# Patient Record
Sex: Male | Born: 1970 | Race: Black or African American | Marital: Married | Smoking: Never smoker
Health system: Southern US, Community
[De-identification: ages and names within clinical notes are randomized; demographics above are authoritative.]

---

## 2016-03-23 ENCOUNTER — Other Ambulatory Visit: Payer: Self-pay | Admitting: Family Medicine

## 2016-03-23 DIAGNOSIS — Z1889 Other specified retained foreign body fragments: Secondary | ICD-10-CM

## 2016-03-23 DIAGNOSIS — M79672 Pain in left foot: Secondary | ICD-10-CM

## 2016-04-08 ENCOUNTER — Ambulatory Visit
Admission: RE | Admit: 2016-04-08 | Discharge: 2016-04-08 | Disposition: A | Discharge status: Home / Self Care | Payer: No Typology Code available for payment source | Source: Ambulatory Visit | Attending: Family Medicine | Admitting: Family Medicine

## 2016-04-08 DIAGNOSIS — Z1889 Other specified retained foreign body fragments: Secondary | ICD-10-CM

## 2016-04-08 DIAGNOSIS — M79672 Pain in left foot: Secondary | ICD-10-CM

## 2016-04-08 MED ORDER — GADOBENATE DIMEGLUMINE 529 MG/ML IV SOLN
20.0000 mL | Freq: Once | INTRAVENOUS | Status: AC | PRN
  Administered 2016-04-08: 20 mL via INTRAVENOUS

## 2017-04-20 IMAGING — MR MR FOOT*L* WO/W CM
6 of 9 series · 31 of 40 positions shown · IV contrast (20m multihance)
Comparison: None.

CLINICAL DATA: Left foot pain and swelling. Bunion. Weakness and
tenderness.

EXAM:
MRI OF THE LEFT FOREFOOT WITHOUT AND WITH CONTRAST
TECHNIQUE: Multiplanar, multisequence MR imaging of the left forefoot was
performed both before and after administration of intravenous
contrast.
CONTRAST:  20mL MULTIHANCE GADOBENATE DIMEGLUMINE 529 MG/ML IV SOLN
Creatinine was obtained on site at [HOSPITAL] at [HOSPITAL].
Results: Creatinine 1.3 mg/dL.

[Series 7: T1 · coronal · 4.0mm · 0.44mm/px · 6 of 33 slices shown (1 of 2)]
[im 1/33]
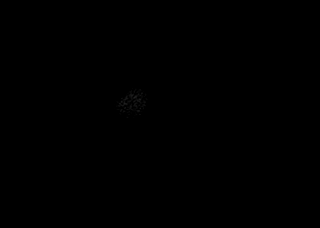
[im 7/33]
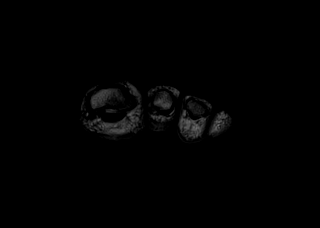
[im 13/33]
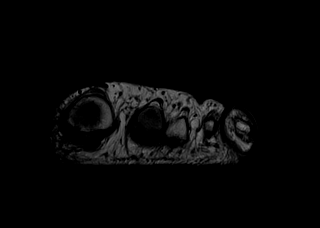
[im 20/33]
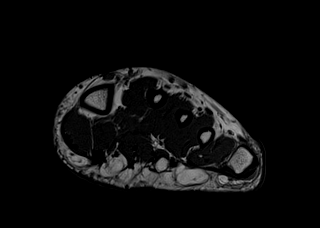
[im 26/33]
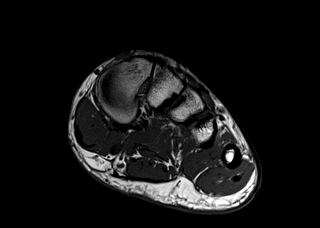
[im 33/33]
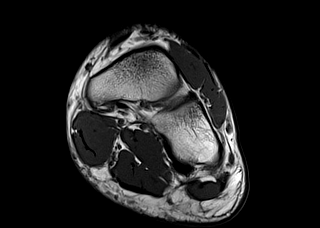

[Series 8: T2 fat-sat · coronal · 4.0mm · 0.27mm/px · 6 of 34 slices shown]
[im 1/34]
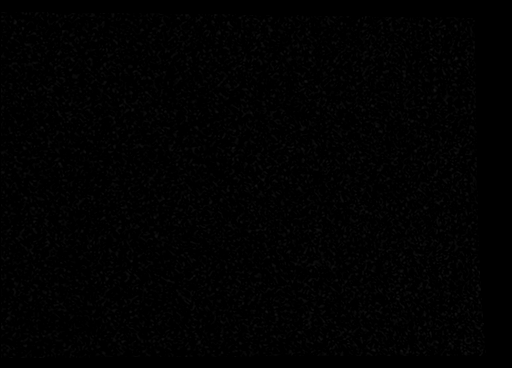
[im 7/34]
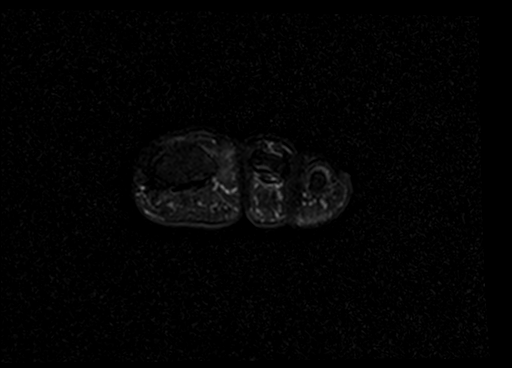
[im 14/34]
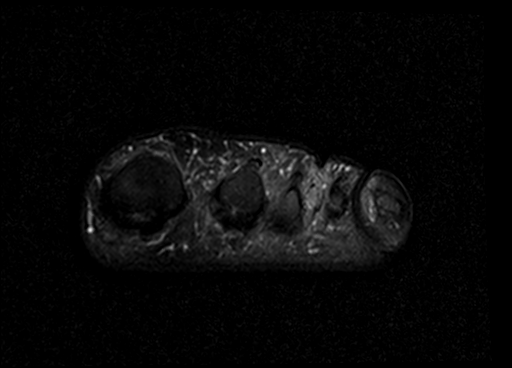
[im 20/34]
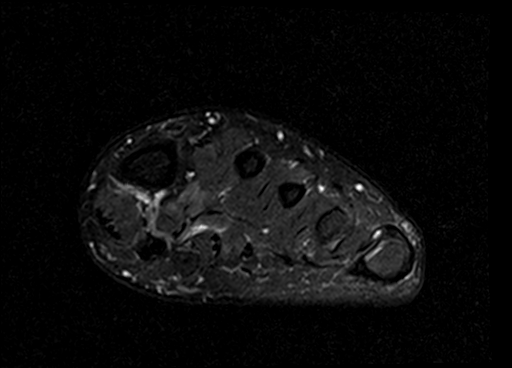
[im 27/34]
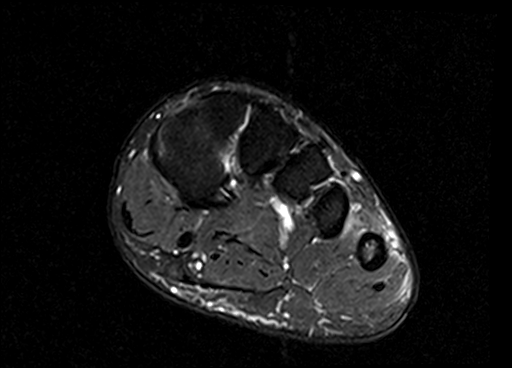
[im 34/34]
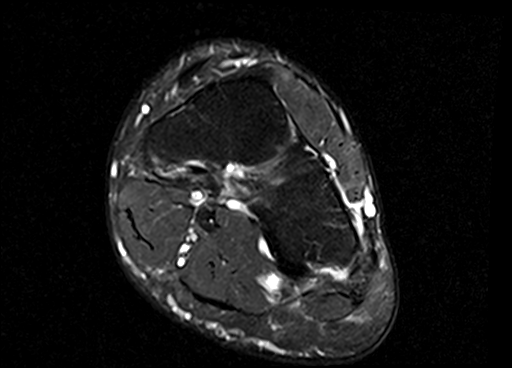

[Series 9: T1 fat-sat · coronal · 4.0mm · 0.44mm/px · 6 of 33 slices shown]
[im 1/33]
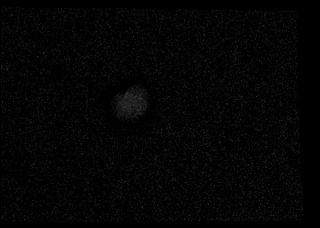
[im 7/33]
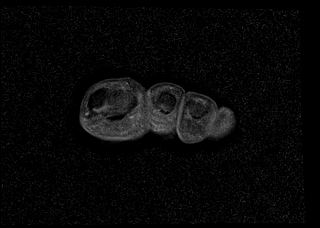
[im 13/33]
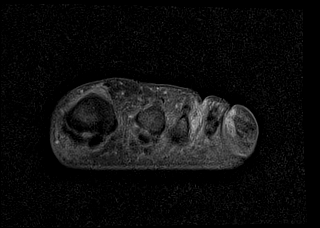
[im 20/33]
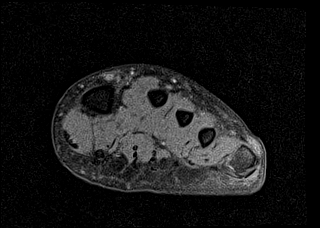
[im 26/33]
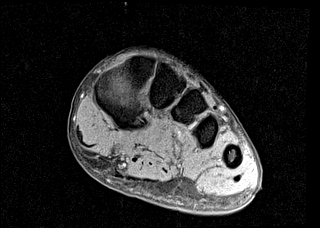
[im 33/33]
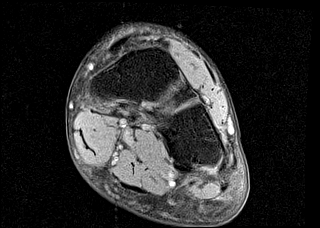

[Series 11: T1 · axial · 3.0mm · 0.31mm/px · z∈[-129,-73]mm · 3 of 20 slices shown (2 of 2)]
[im 1/20]
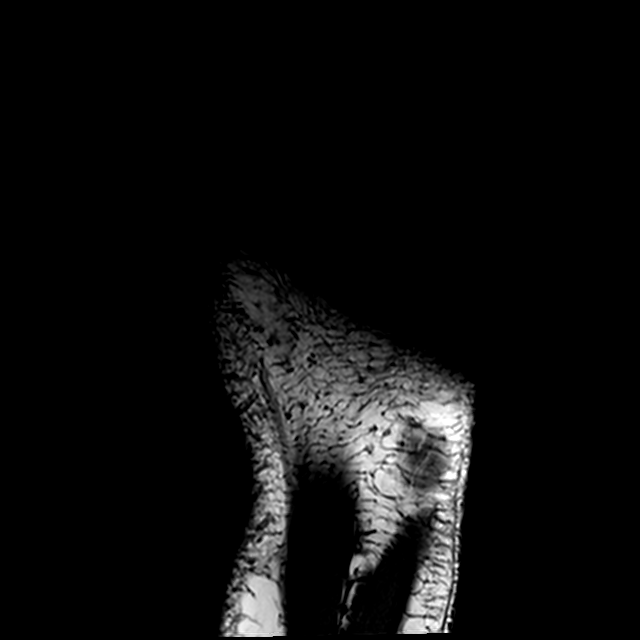
[im 10/20]
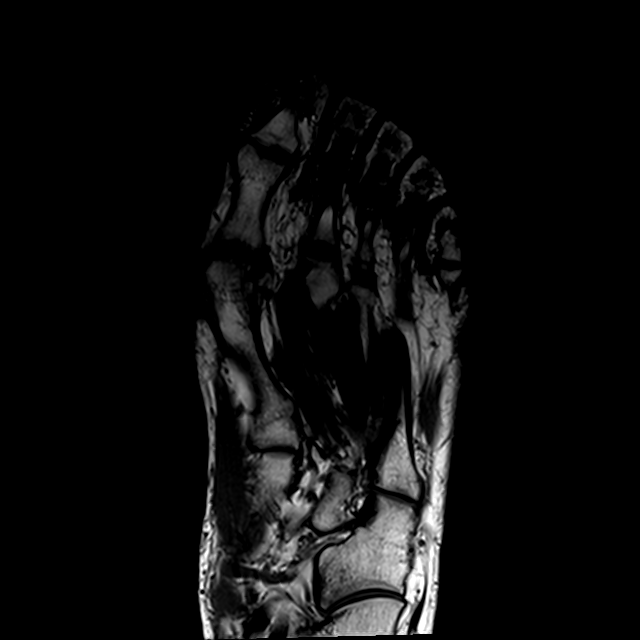
[im 20/20]
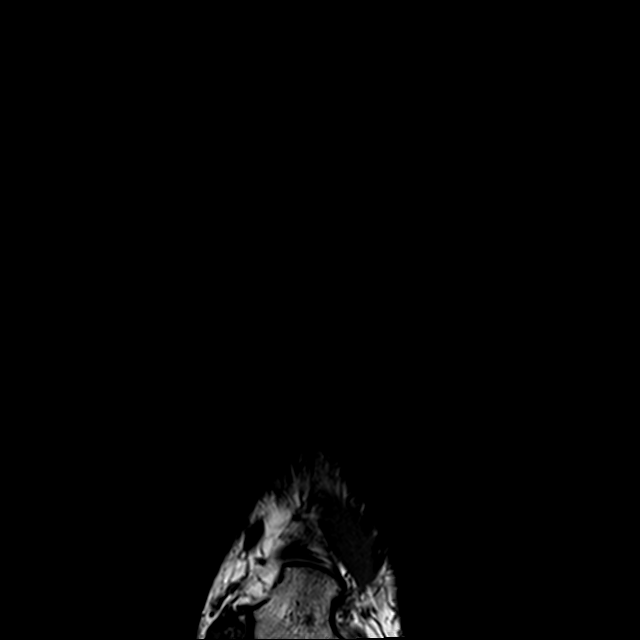

[Series 13: T1 fat-sat post-contrast · coronal · 4.0mm · 0.44mm/px · 6 of 34 slices shown (1 of 2)]
[im 1/34]
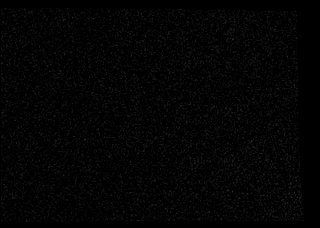
[im 7/34]
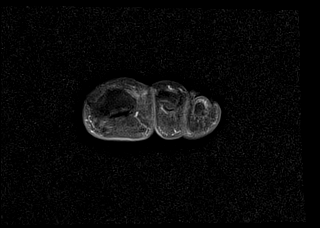
[im 14/34]
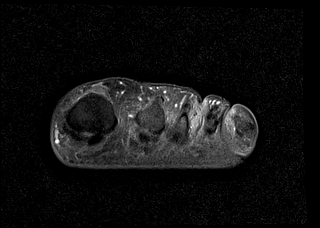
[im 20/34]
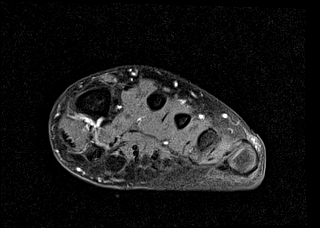
[im 27/34]
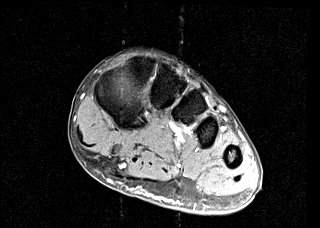
[im 34/34]
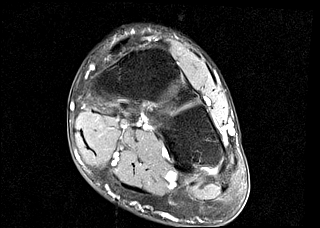

[Series 14: T1 fat-sat post-contrast · sagittal · 4.0mm · 0.35mm/px · 4 of 21 slices shown (2 of 2)]
[im 1/21]
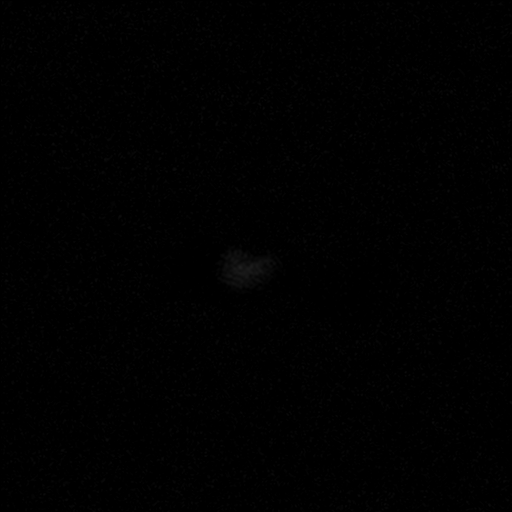
[im 7/21]
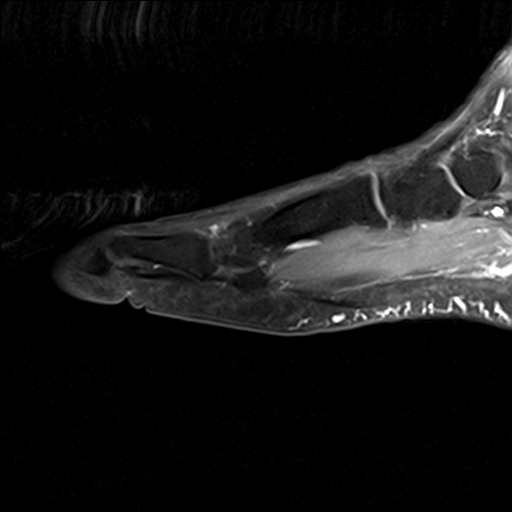
[im 14/21]
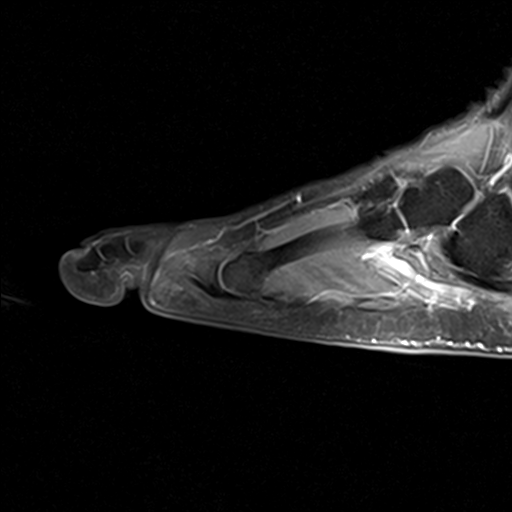
[im 21/21]
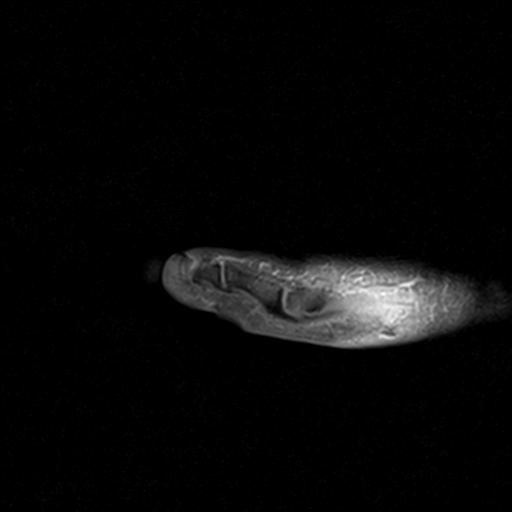

[31 of 40 positions shown; findings below may reference images not displayed]

FINDINGS: Bones/Joint/Cartilage

Degenerative subcortical cyst or small geode along the distal medial
margin of the first metatarsal head, image [DATE]. Mild hallux valgus
with mild spurring at the first MTP joint. Multipartite medial
sesamoid of the first digit with subtle associated enhancement in
the medial sesamoid which may reflect low grade inflammation, image
17/15. No other significant abnormal osseous edema is identified.
There is no evidence of a stress fracture in the visualized part of
the midfoot, metatarsals, or phalanges.

Ligaments

Lisfranc ligament normal. No specific ligamentous derangement along
the first digit or elsewhere in the forefoot.

Muscles and Tendons

No abnormal regional muscular edema or atrophy.

Soft tissues

No convincing intermetatarsal mass to suggest Morton's neuroma. No
significant intermetatarsal bursitis or other appreciable additional
abnormality to further explain the patient' s symptoms.
IMPRESSION: 1. Multipartite medial sesamoid of the first digit with some
low-level enhancement and edema suggesting mild sesamoiditis.
2. There is also degenerative arthropathy at the first MTP joint and
a small degenerative subcortical cyst or geode along the medial head
of the first metatarsal.
3. No stress fracture, muscular edema or atrophy, Morton's neuroma,
or other specific cause for the patient's described symptoms
identified.

## 2018-01-09 ENCOUNTER — Ambulatory Visit: Payer: No Typology Code available for payment source | Admitting: Podiatry

## 2018-01-16 ENCOUNTER — Ambulatory Visit (INDEPENDENT_AMBULATORY_CARE_PROVIDER_SITE_OTHER): Payer: No Typology Code available for payment source

## 2018-01-16 ENCOUNTER — Encounter: Payer: Self-pay | Admitting: Podiatry

## 2018-01-16 ENCOUNTER — Ambulatory Visit (INDEPENDENT_AMBULATORY_CARE_PROVIDER_SITE_OTHER): Payer: No Typology Code available for payment source | Admitting: Podiatry

## 2018-01-16 ENCOUNTER — Other Ambulatory Visit: Payer: Self-pay | Admitting: Podiatry

## 2018-01-16 VITALS — BP 142/89 | HR 75

## 2018-01-16 DIAGNOSIS — M722 Plantar fascial fibromatosis: Secondary | ICD-10-CM

## 2018-01-16 DIAGNOSIS — M779 Enthesopathy, unspecified: Secondary | ICD-10-CM | POA: Diagnosis not present

## 2018-01-16 DIAGNOSIS — M7750 Other enthesopathy of unspecified foot: Secondary | ICD-10-CM

## 2018-01-16 DIAGNOSIS — M129 Arthropathy, unspecified: Secondary | ICD-10-CM | POA: Diagnosis not present

## 2018-01-16 NOTE — Progress Notes (Signed)
This patient presents to the office with chief complaint of painful feet both as well as painful left ankle.  He states his feet have been painful for over 10 years now walking and wearing his shoes.  He was in the [employer] for over 20 years.  He says that his left ankle has become worse than his right ankle.  He shows me a picture of the significantly swollen left and right ankle.  He says the left ankle became swollen 6 weeks ago and it took 6 weeks for him to get this appointment.  He says that his ankle. His improved, but there is still mild pain at the left ankle and points to the ankle bone on the inside and the outside of the left ankle. It was difficult to acquire a timely history.  He says that he had blood work done and the gout level was 7 and he was told it was a high normal. He also says that hen diagnosed with plantar fasciitis on both feet.  He says that he has been taking ibuprofen and Aleve to help with the pain as well as stretching and rolling and exercising his foot.  He says he has already received 3 injections in his left foot.  His last injection was 1 year ago. He states he still has pain upon rising in the morning and standing from a sitting position in both heels. He says he is wearing orthoses that were made by [employer] in [place] and says they cause pain and discomfort when he wears them in his shoes He presents the office today for an evaluation and treatment of his painful.  Vascular  Dorsalis pedis and posterior tibial pulses are palpable  B/L.  Capillary return  WNL.  Temperature gradient is  WNL.  Skin turgor  WNL  Sensorium  Senn Weinstein monofilament wire  WNL. Normal tactile sensation.  Nail Exam  Patient has normal nails with no evidence of bacterial or fungal infection.  Orthopedic  Exam  Muscle tone and muscle strength  WNL.  No limitations of motion feet  B/L.  No crepitus or joint effusion noted.  Foot type is unremarkable and digits show no abnormalities. Palpable pain  noted to the medial and lateral malleolus left ankle.  Patient has palpable pain noted at the insertion of the plantar fascia, left greater than the right.  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  Functional hallux limitus first MPJ of the right foot. Flexible pes planus foot type.  Pes planus. Upon weight-bearing.  Skin  No open lesions.  Normal skin texture and turgor.  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    HAV left  FHL right   IE  X-rays taken reveal and elongated first metatarsal right foot.  Evaluation of the left foot reveals a dorsomedial exostosis with an HAV deformity.  Examination of his ankle reveals osteophytes in the gutter at the site of the medial malleolus.  Discussed this condition with this patient.  I told him I am concerned about his acute ankle swelling that has occurred in both feet.  Therefore, I ordered additional blood work and her arthritis profile.  There is no evidence of any acute or arthritic condition in his ankles. At this point,  I recommended we prescribe him a Mobic  to  help evaluate his condition.  A prescription for Mobic and a prescription for blood work were given to this patient so that he can  bring them to the [place].  I also suggested he received his previous blood work and return to the office in 2 weeks so we can evaluate his feet.   Helane Gunther DPM

## 2018-01-30 ENCOUNTER — Ambulatory Visit (INDEPENDENT_AMBULATORY_CARE_PROVIDER_SITE_OTHER): Payer: No Typology Code available for payment source | Admitting: Podiatry

## 2018-01-30 ENCOUNTER — Encounter: Payer: Self-pay | Admitting: Podiatry

## 2018-01-30 DIAGNOSIS — M779 Enthesopathy, unspecified: Secondary | ICD-10-CM

## 2018-01-30 DIAGNOSIS — M722 Plantar fascial fibromatosis: Secondary | ICD-10-CM | POA: Diagnosis not present

## 2018-01-30 DIAGNOSIS — M1A0710 Idiopathic chronic gout, right ankle and foot, without tophus (tophi): Secondary | ICD-10-CM

## 2018-01-30 DIAGNOSIS — M7750 Other enthesopathy of unspecified foot: Secondary | ICD-10-CM

## 2018-01-30 DIAGNOSIS — M129 Arthropathy, unspecified: Secondary | ICD-10-CM

## 2018-01-30 NOTE — Progress Notes (Signed)
This patient returns to the office follow-up for diagnosis of a tendinitis right ankle and plantar fasciitis.  This past patient was treated with Mobic by mouth.  He was also given a prescription to receive blood work for arthritis profile which he brought to the [place]. he returned to the office today stating that the results indicated increased gout.  He says that his right ankle is markedly improved from his previous visit.  He says the Mobic that he has taken was not successful in eliminating the pain and the swelling.  He says he still believes he has some pain and discomfort in the big toe joint of the left foot.  He is very pleased with his progress.  Vascular  Dorsalis pedis and posterior tibial pulses are palpable  B/L.  Capillary return  WNL.  Temperature gradient is  WNL.  Skin turgor  WNL  Sensorium  Senn Weinstein monofilament wire  WNL. Normal tactile sensation.  Nail Exam  Patient has normal nails with no evidence of bacterial or fungal infection.  Orthopedic  Exam  Muscle tone and muscle strength  WNL.  No limitations of motion feet  B/L.  No crepitus or joint effusion noted.  No evidence of any pain or swelling noted on the right ankle.  No increased temperature of the right ankle.  Continue pain noted through the arch of both feet.  Patient has an HAV deformity first MPJ bilateral with no limitation of motion.  Mild discomfort first MPJ left foot.  Skin  No open lesions.  Normal skin texture and turgor.  Diagnosis gout chronic arthropathy plantar fasciitis  ROV.  Reviewed his blood work which revealed his gout level to be 8.1.  This is elevated from the 7.0 which he told me he had 2 weeks ago.  His right ankle has markedly improved and he is not having any pain or discomfort.  He still has plantar fascial pain due to his flatfootedness.  I told this patient that he should continue on the Mobic and to wear the orthoses which I reviewed last visit.  I told him he needs to see his medical  doctor and be started on gout medication to prevent future flareups.  RTC prn.   Helane Gunther DPM

## 2018-02-02 ENCOUNTER — Telehealth: Payer: Self-pay | Admitting: Podiatry

## 2018-02-02 NOTE — Telephone Encounter (Signed)
I saw Dr. Stacie Acres on Monday, 30 September. I'm needing a copy of my AVS to get travel pay from the [place]. If you could please e-mail the AVS to me at [email or web address]. My phone number is [phone]. Thank you.

## 2018-02-02 NOTE — Telephone Encounter (Signed)
Pt would like to have his notes emailed to him, at the email listed. I think he left you a voicemail also. He said he [facility] to show that he came to his appt.

## 2018-02-06 ENCOUNTER — Telehealth: Payer: Self-pay | Admitting: Podiatry

## 2018-02-06 NOTE — Telephone Encounter (Signed)
Please Fax notes to [phone] Attn: Team [id number]

## 2018-02-07 ENCOUNTER — Ambulatory Visit (INDEPENDENT_AMBULATORY_CARE_PROVIDER_SITE_OTHER): Payer: No Typology Code available for payment source | Admitting: Podiatry

## 2018-02-07 ENCOUNTER — Encounter: Payer: Self-pay | Admitting: Podiatry

## 2018-02-07 DIAGNOSIS — M722 Plantar fascial fibromatosis: Secondary | ICD-10-CM

## 2018-02-07 DIAGNOSIS — M25572 Pain in left ankle and joints of left foot: Secondary | ICD-10-CM

## 2018-02-07 NOTE — Progress Notes (Signed)
This patient presents to the office with continued pain in his left ankle.  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 he has been treated successfully for gout in his right ankle.  He now returns to the office saying he is experiencing pain and discomfort under his left ankle.  He says he has no history of trauma or injury to the foot.  He previously has been treated diagnosed with bunion first MPJ bilateral with pes planus foot type.  Patient was successfully treated for gout but he now has pain and discomfort walking and wearing his shoes.  He says that he picked up a pair of orthotics from [employer] in the car causing more pain and discomfort.  He points to an area under the outside ankle bone left foot as the site of most pain.  He presents today for continued evaluation of his left foot.  Vascular  Dorsalis pedis and posterior tibial pulses are palpable  B/L.  Capillary return  WNL.  Temperature gradient is  WNL.  Skin turgor  WNL  Sensorium  Senn Weinstein monofilament wire  WNL. Normal tactile sensation.  Nail Exam  Patient has normal nails with no evidence of bacterial or fungal infection.  Orthopedic  Exam  Muscle tone and muscle strength  WNL.  No limitations of motion feet  B/L.  No crepitus or joint effusion noted.  Pes planus foot type bilaterally.  Severe HAV deformities first MPJ bilaterally.  No evidence of any redness swelling in his left rear foot.  Sinus tarsitis pain is elicited upon palpation.  Skin  No open lesions.  Normal skin texture and turgor.  Sinus tarsitis left foot secondary pes planus foot type  ROV>  Injection therapy sinus tarsitis left foot. Injection therapy using 1.0 cc. Of 2% xylocaine( 20 mg.) plus 1 cc. of kenalog-la ( 10 mg) plus 1/2 cc. of dexamethazone phosphate ( 2 mg).  Consulted with Raiford Noble concerning his orthotics and we both determined he needs to have his metatarsal pad removed and the Morton's extension removed from his orthotic before wearing it  again.  Return to the office as needed   Helane Gunther DPM

## 2018-03-21 ENCOUNTER — Encounter: Payer: Self-pay | Admitting: Podiatry

## 2018-03-21 ENCOUNTER — Ambulatory Visit (INDEPENDENT_AMBULATORY_CARE_PROVIDER_SITE_OTHER): Payer: No Typology Code available for payment source | Admitting: Podiatry

## 2018-03-21 DIAGNOSIS — M25572 Pain in left ankle and joints of left foot: Secondary | ICD-10-CM

## 2018-03-22 ENCOUNTER — Encounter: Payer: Self-pay | Admitting: Podiatry

## 2018-03-22 NOTE — Progress Notes (Signed)
This patient presents to the office with continued pain in his left ankle.  [facility] is a [facility] [place]. He has been treated successfully for gout in his right ankle.  He now returns to the office saying he is experiencing pain and discomfort under his left ankle.  He says he has no history of trauma or injury to the foot.  He previously has been treated diagnosed with bunion first MPJ bilateral with pes planus foot type.  Patient walks into the ofice with an antalgic gait.   He says that he picked up a pair of orthotics from [employer] in the car causing more pain and discomfort.  He points to an area under the outside ankle bone left foot as the site of most pain. He says the injection from last visit helped until  11/17.    He presents today for continued evaluation of his left foot.  Vascular  Dorsalis pedis and posterior tibial pulses are palpable  B/L.  Capillary return  WNL.  Temperature gradient is  WNL.  Skin turgor  WNL  Sensorium  Senn Weinstein monofilament wire  WNL. Normal tactile sensation.  Nail Exam  Patient has normal nails with no evidence of bacterial or fungal infection.  Orthopedic  Exam  Muscle tone and muscle strength  WNL.  No limitations of motion feet  B/L.  No crepitus or joint effusion noted.  Pes planus foot type bilaterally.  Severe HAV deformities first MPJ bilaterally.  No evidence of any redness swelling in his left rear foot.  Sinus tarsitis pain is elicited upon palpation.  Skin  No open lesions.  Normal skin texture and turgor.  Sinus tarsitis left foot secondary pes planus foot type  ROV.    Injection therapy sinus tarsitis left foot. Injection therapy using 1.0 cc. Of 2% xylocaine( 20 mg.) plus 1 cc. of kenalog-la ( 10 mg) plus 1/2 cc. of dexamethazone phosphate ( 2 mg).  We have attempted to apply modifications to his orthoses but problem persists.    Return to the office as needed.  Discussed this condition with this patient.  He also presents to  the office with his wife.  Told him he would benefit from a cam walker if he could acquire one [place]. he was told to wear the cam walker when he feels pain in his left heel starting.  Also suggested an ankle brace would be helpful.  I told this patient that if the pain continues he needs to be seen by Dr. Logan BoresEvans for an MRI and further testing.  Return to the clinic as needed.   Helane GuntherGregory Alzada Brazee DPM
